# Patient Record
Sex: Male | Born: 1940 | Race: White | Hispanic: No | Marital: Married
Health system: Southern US, Community
[De-identification: ages and names within clinical notes are randomized; demographics above are authoritative.]

---

## 2019-06-16 ENCOUNTER — Other Ambulatory Visit (HOSPITAL_COMMUNITY): Payer: Medicare Other

## 2019-06-16 ENCOUNTER — Inpatient Hospital Stay
Admission: RE | Admit: 2019-06-16 | Discharge: 2019-07-25 | Disposition: A | Discharge status: Skilled Nursing Facility | Payer: Medicare Other | Source: Ambulatory Visit | Attending: Internal Medicine | Admitting: Internal Medicine

## 2019-06-16 DIAGNOSIS — R34 Anuria and oliguria: Secondary | ICD-10-CM

## 2019-06-16 DIAGNOSIS — T17908A Unspecified foreign body in respiratory tract, part unspecified causing other injury, initial encounter: Secondary | ICD-10-CM

## 2019-06-16 DIAGNOSIS — K5900 Constipation, unspecified: Secondary | ICD-10-CM

## 2019-06-17 LAB — COMPREHENSIVE METABOLIC PANEL
ALT: 12 U/L (ref 0–44)
AST: 17 U/L (ref 15–41)
Albumin: 2.4 g/dL — ABNORMAL LOW (ref 3.5–5.0)
Alkaline Phosphatase: 56 U/L (ref 38–126)
Anion gap: 11 (ref 5–15)
BUN: 9 mg/dL (ref 8–23)
CO2: 26 mmol/L (ref 22–32)
Calcium: 8.7 mg/dL — ABNORMAL LOW (ref 8.9–10.3)
Chloride: 103 mmol/L (ref 98–111)
Creatinine, Ser: 0.66 mg/dL (ref 0.61–1.24)
GFR calc Af Amer: >60 mL/min (ref >60)
GFR calc non Af Amer: >60 mL/min (ref >60)
Glucose, Bld: 106 mg/dL — ABNORMAL HIGH (ref 70–99)
Potassium: 3.7 mmol/L (ref 3.5–5.1)
Sodium: 140 mmol/L (ref 135–145)
Total Bilirubin: 0.8 mg/dL (ref 0.3–1.2)
Total Protein: 5.3 g/dL — ABNORMAL LOW (ref 6.5–8.1)

## 2019-06-17 LAB — PROTIME-INR
INR: 1.4 — ABNORMAL HIGH (ref 0.8–1.2)
Prothrombin Time: 16.5 seconds — ABNORMAL HIGH (ref 11.4–15.2)

## 2019-06-17 LAB — CBC WITH DIFFERENTIAL/PLATELET
Abs Immature Granulocytes: 0.07 10*3/uL (ref 0.00–0.07)
Basophils Absolute: 0 10*3/uL (ref 0.0–0.1)
Basophils Relative: 0 %
Eosinophils Absolute: 0 10*3/uL (ref 0.0–0.5)
Eosinophils Relative: 0 %
HCT: 42.3 % (ref 39.0–52.0)
Hemoglobin: 13.9 g/dL (ref 13.0–17.0)
Immature Granulocytes: 1 %
Lymphocytes Relative: 18 %
Lymphs Abs: 1.7 10*3/uL (ref 0.7–4.0)
MCH: 29.9 pg (ref 26.0–34.0)
MCHC: 32.9 g/dL (ref 30.0–36.0)
MCV: 91 fL (ref 80.0–100.0)
Monocytes Absolute: 1 10*3/uL (ref 0.1–1.0)
Monocytes Relative: 10 %
Neutro Abs: 6.4 10*3/uL (ref 1.7–7.7)
Neutrophils Relative %: 71 %
Platelets: 102 10*3/uL — ABNORMAL LOW (ref 150–400)
RBC: 4.65 MIL/uL (ref 4.22–5.81)
RDW: 15.9 % — ABNORMAL HIGH (ref 11.5–15.5)
WBC: 9.1 10*3/uL (ref 4.0–10.5)
nRBC: 0 % (ref 0.0–0.2)

## 2019-06-17 LAB — URINALYSIS, ROUTINE W REFLEX MICROSCOPIC
Bilirubin Urine: NEGATIVE
Glucose, UA: NEGATIVE mg/dL
Ketones, ur: NEGATIVE mg/dL
Nitrite: NEGATIVE
Protein, ur: 100 mg/dL — AB
RBC / HPF: >50 RBC/hpf — ABNORMAL HIGH (ref 0–5)
Specific Gravity, Urine: 1.019 (ref 1.005–1.030)
WBC, UA: >50 WBC/hpf — ABNORMAL HIGH (ref 0–5)
pH: 5 (ref 5.0–8.0)

## 2019-06-17 LAB — HEMOGLOBIN A1C
Hgb A1c MFr Bld: 5.6 % (ref 4.8–5.6)
Mean Plasma Glucose: 114.02 mg/dL

## 2019-06-17 LAB — MAGNESIUM: Magnesium: 1.5 mg/dL — ABNORMAL LOW (ref 1.7–2.4)

## 2019-06-17 LAB — PHOSPHORUS: Phosphorus: 3.5 mg/dL (ref 2.5–4.6)

## 2019-06-17 LAB — TSH: TSH: 1.32 u[IU]/mL (ref 0.350–4.500)

## 2019-06-17 MED ORDER — GENERIC EXTERNAL MEDICATION
Status: DC
Start: 2019-06-16 — End: 2019-06-17

## 2019-06-17 MED ORDER — AMLODIPINE BESYLATE 5 MG PO TABS
5.00 | ORAL_TABLET | ORAL | Status: DC
Start: 2019-06-17 — End: 2019-06-17

## 2019-06-17 MED ORDER — OLANZAPINE 2.5 MG PO TABS
2.50 | ORAL_TABLET | ORAL | Status: DC
Start: 2019-06-16 — End: 2019-06-17

## 2019-06-17 MED ORDER — CLONIDINE HCL 0.1 MG PO TABS
0.10 | ORAL_TABLET | ORAL | Status: DC
Start: ? — End: 2019-06-17

## 2019-06-17 MED ORDER — TIMOLOL MALEATE 0.5 % OP SOLN
1.00 | OPHTHALMIC | Status: DC
Start: 2019-06-17 — End: 2019-06-17

## 2019-06-17 MED ORDER — MELATONIN 3 MG PO TABS
3.00 | ORAL_TABLET | ORAL | Status: DC
Start: 2019-06-16 — End: 2019-06-17

## 2019-06-17 MED ORDER — ENOXAPARIN SODIUM 40 MG/0.4ML ~~LOC~~ SOLN
40.00 | SUBCUTANEOUS | Status: DC
Start: 2019-06-16 — End: 2019-06-17

## 2019-06-17 MED ORDER — LINEZOLID 600 MG PO TABS
600.00 | ORAL_TABLET | ORAL | Status: DC
Start: 2019-06-16 — End: 2019-06-17

## 2019-06-17 MED ORDER — LISINOPRIL 20 MG PO TABS
20.00 | ORAL_TABLET | ORAL | Status: DC
Start: 2019-06-17 — End: 2019-06-17

## 2019-06-17 MED ORDER — SODIUM CHLORIDE 0.9 % IV SOLN
500.00 | INTRAVENOUS | Status: DC
Start: ? — End: 2019-06-17

## 2019-06-17 MED ORDER — DIPHENHYDRAMINE HCL 50 MG/ML IJ SOLN
50.00 | INTRAMUSCULAR | Status: DC
Start: ? — End: 2019-06-17

## 2019-06-17 MED ORDER — LEVOFLOXACIN 750 MG PO TABS
750.00 | ORAL_TABLET | ORAL | Status: DC
Start: 2019-06-17 — End: 2019-06-17

## 2019-06-17 MED ORDER — OLANZAPINE 2.5 MG PO TABS
2.50 | ORAL_TABLET | ORAL | Status: DC
Start: ? — End: 2019-06-17

## 2019-06-17 MED ORDER — OLANZAPINE 10 MG IM SOLR
2.50 | INTRAMUSCULAR | Status: DC
Start: ? — End: 2019-06-17

## 2019-06-17 MED ORDER — VITAMIN D3 25 MCG (1000 UNIT) PO TABS
1000.00 | ORAL_TABLET | ORAL | Status: DC
Start: 2019-06-17 — End: 2019-06-17

## 2019-06-17 MED ORDER — ONDANSETRON 4 MG PO TBDP
4.00 | ORAL_TABLET | ORAL | Status: DC
Start: ? — End: 2019-06-17

## 2019-06-17 MED ORDER — RUBBER BATH MAT MISC
Status: DC
Start: 2019-06-16 — End: 2019-06-17

## 2019-06-17 MED ORDER — METRONIDAZOLE 250 MG PO TABS
500.00 | ORAL_TABLET | ORAL | Status: DC
Start: 2019-06-16 — End: 2019-06-17

## 2019-06-17 NOTE — Consult Note (Signed)
Infectious Disease Consultation   Gregory Mccall.  QQI:[id number]  DOB: Mar 01, 1941  DOA: 06/16/2019  Requesting physician: Dr.Hijazi  Reason for consultation: Antibiotic recommendations   History of Present Illness: Patient unable to provide history and information secondary to his dementia.  Therefore obtained from the medical records. Gregory Mccall. is an 78 y.o. male with history of hypertension, prostate cancer, advanced dementia who was admitted initially at behavioral health unit for management of his behavioral disorders due to his dementia.  Patient apparently remained increasingly drowsy and was found to have a fever.  He had decubitus wound on his gluteal area that was progressively worsening.  He was transferred to the medical floor.  CT of the abdomen and pelvis showed soft tissue gas in the right perirectal fat, posterior and left of the coccyx involving the right gluteal musculature worrisome for necrotizing fasciitis.  He was also found to have leukocytosis.  Patient was found to be hypotensive, and septic shock. He was initially intubated and was in the ICU care.  He was taken to the OR urgently on 05/28/2019 for debridement of his complex wound/ulcer.  Blood cultures on 05/27/2019 did not show any growth.  He had fevers.  Repeat blood cultures on 05/28/2019 showed 1 set with 2 strains of staph epidermidis both MSSE while the other side showed MSSE and MRSE.  Urine cultures showed 100,000 CFU per mL Pantoea agglomerans, Enterococcus faecalis.  Patient underwent debridement again on 05/30/2019.  Specimen from the gluteal abscess showed E. coli, Clostridium sporogenes, Enterococcus species,Bacteriodes fragilis, Pseudomonas aeruginosa.  He was initially on pressors but weaned off pressors.  He has received treatment with multiple antibiotics including vancomycin, cefepime, clindamycin, Zosyn, ciprofloxacin.  He also had lactic acidosis.  He has ostomy, Foley catheter.   Once he stabilized [facility] was [facility]. Patient at this time knows he is in the hospital but he is confused about the year.  Review of Systems:  Patient history of dementia, confused.  Unable to obtain review of systems at this time from the patient.   Past Medical History: Dyslipidemia, hypertension, prostate cancer, dementia, colon polyps, obesity, obstructive sleep apnea, GERD, history of Bell's palsy, polycythemia vera  Past Surgical History: Ankle fracture surgery, laminectomy in 2011, prostatectomy in 2004.  Recent surgery for necrotizing fasciitis.  Allergies: No known drug allergies  Social History: He is a former smoker, no history of alcohol or recreational drug abuse  Family History: History of hypertension in father and sister, cancer in maternal aunt   Physical Exam: Temperature 97.7, pulse 93, respiratory 23, blood pressure 136/83, oxygen saturation 95% on oxygen nasal cannula.  Constitutional: Ill-appearing male, awake, not in any acute distress at this time Eyes: PERLA, EOMI, irises appear normal, anicteric sclera,  ENMT: external ears and nose appear normal, hard of hearing            Neck: neck appears normal, no masses, normal ROM  CVS: S1-S2 clear, no murmur Respiratory: Occasional rhonchi, decreased breath sounds lower lobes, no wheezing Abdomen: Surgical incision mid abdomen, ostomy, positive bowel sounds Musculoskeletal: Mild lower extremity edema Neuro: Patient has dementia, oriented x2, has generalized weakness, no focal deficits Psych: Has dementia Skin: Sacrococcygeal stage IV pressure ulcer status post recent debridement for necrotizing fasciitis  Data reviewed:  I have personally reviewed following labs and imaging studies Labs:  CBC: Recent Labs  Lab 06/17/19 0456  WBC 9.1  NEUTROABS 6.4  HGB 13.9  HCT 42.3  MCV 91.0  PLT 102*    Basic Metabolic Panel: Recent Labs  Lab 06/17/19 0456  NA 140  K 3.7  CL 103  CO2 26   GLUCOSE 106*  BUN 9  CREATININE 0.66  CALCIUM 8.7*  MG 1.5*  PHOS 3.5   GFR CrCl cannot be calculated (Unknown ideal weight.). Liver Function Tests: Recent Labs  Lab 06/17/19 0456  AST 17  ALT 12  ALKPHOS 56  BILITOT 0.8  PROT 5.3*  ALBUMIN 2.4*   No results for input(s): LIPASE, AMYLASE in the last 168 hours. No results for input(s): AMMONIA in the last 168 hours. Coagulation profile Recent Labs  Lab 06/17/19 0456  INR 1.4*    Cardiac Enzymes: No results for input(s): CKTOTAL, CKMB, CKMBINDEX, TROPONINI in the last 168 hours. BNP: Invalid input(s): POCBNP CBG: No results for input(s): GLUCAP in the last 168 hours. D-Dimer No results for input(s): DDIMER in the last 72 hours. Hgb A1c Recent Labs    06/17/19 0456  HGBA1C 5.6   Lipid Profile No results for input(s): CHOL, HDL, LDLCALC, TRIG, CHOLHDL, LDLDIRECT in the last 72 hours. Thyroid function studies Recent Labs    06/17/19 0456  TSH 1.320   Anemia work up No results for input(s): VITAMINB12, FOLATE, FERRITIN, TIBC, IRON, RETICCTPCT in the last 72 hours. Urinalysis    Component Value Date/Time   COLORURINE AMBER (A) 06/17/2019 1524   APPEARANCEUR CLOUDY (A) 06/17/2019 1524   LABSPEC 1.019 06/17/2019 1524   PHURINE 5.0 06/17/2019 1524   GLUCOSEU NEGATIVE 06/17/2019 1524   HGBUR LARGE (A) 06/17/2019 [place] 06/17/2019 [place] 06/17/2019 1524   PROTEINUR 100 (A) 06/17/2019 1524   NITRITE NEGATIVE 06/17/2019 1524   LEUKOCYTESUR MODERATE (A) 06/17/2019 1524     Microbiology No results found for this or any previous visit (from the past 240 hour(s)).     Inpatient Medications:   Scheduled Meds: Please see MAR   Radiological Exams on Admission: Dg Chest Port 1 View  Result Date: 06/16/2019 CLINICAL DATA:  Aspiration. EXAM: PORTABLE CHEST 1 VIEW COMPARISON:  None. FINDINGS: Poor inspiration. Normal sized heart. Clear lungs. Thoracic spine  degenerative changes. IMPRESSION: No acute abnormality. Electronically Signed   By: Claudie Revering M.D.   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  He is status post debridement x2.  Cultures as mentioned above in the HPI.  While at the outside facility he received treatment with IV vancomycin, Zosyn.  He also received treatment with cefepime, ciprofloxacin.  Currently shock is resolved.  However, he is at very high risk for recurrent sepsis.  If he starts having any worsening fevers or leukocytosis would recommend to send for pan cultures.  Necrotizing fasciitis of the gluteal area: Status post debridement x2.  Continue local wound care.  Specimen from the gluteal abscess showed E. coli, Clostridium sporogenes, Enterococcus species,Bacteriodes fragilis, Pseudomonas aeruginosa.  Currently on treatment with Levaquin, Flagyl, linezolid.  Recommend to treat for duration of 2 weeks.  Please monitor CBC and platelet counts especially with the linezolid.  If he starts having any worsening fevers or worsening leukocytosis then recommend to send for pan cultures.  Stage IV sacrococcygeal pressure ulcer: With superimposed necrotizing fasciitis.  He is  status post debridement as mentioned above.  Continue local wound care.  He is at high risk for wound worsening.  Again, as mentioned above if he starts having any worsening fevers would recommend to send for pan cultures.  Advanced dementia: Patient due to his dementia he will has likely dysphagia with aspiration as well.  He is high risk for worsening respiratory failure secondary to aspiration pneumonia.  Currently on oxygen by nasal cannula.  Continue to monitor.  Obesity/obstructive sleep  apnea: Continue supportive management per primary team.  Moderate protein calorie malnutrition: Management per the primary team.  Due to his complex medical problems he is very high risk for worsening anticoagulation.  Thank you for this consultation.  The above document was dictated using a voice recognition device.  Vonzella NippleAnupama Ishmael Berkovich M.D. 06/17/2019, 5:08 PM

## 2019-06-18 LAB — BASIC METABOLIC PANEL
Anion gap: 9 (ref 5–15)
BUN: 14 mg/dL (ref 8–23)
CO2: 27 mmol/L (ref 22–32)
Calcium: 8.4 mg/dL — ABNORMAL LOW (ref 8.9–10.3)
Chloride: 102 mmol/L (ref 98–111)
Creatinine, Ser: 0.63 mg/dL (ref 0.61–1.24)
GFR calc Af Amer: >60 mL/min (ref >60)
GFR calc non Af Amer: >60 mL/min (ref >60)
Glucose, Bld: 108 mg/dL — ABNORMAL HIGH (ref 70–99)
Potassium: 3.5 mmol/L (ref 3.5–5.1)
Sodium: 138 mmol/L (ref 135–145)

## 2019-06-18 LAB — CBC
HCT: 39.2 % (ref 39.0–52.0)
Hemoglobin: 13.1 g/dL (ref 13.0–17.0)
MCH: 30.3 pg (ref 26.0–34.0)
MCHC: 33.4 g/dL (ref 30.0–36.0)
MCV: 90.5 fL (ref 80.0–100.0)
Platelets: 78 10*3/uL — ABNORMAL LOW (ref 150–400)
RBC: 4.33 MIL/uL (ref 4.22–5.81)
RDW: 15.9 % — ABNORMAL HIGH (ref 11.5–15.5)
WBC: 8.4 10*3/uL (ref 4.0–10.5)
nRBC: 0 % (ref 0.0–0.2)

## 2019-06-18 LAB — URINE CULTURE: Culture: >=100000 — AB

## 2019-06-18 LAB — MAGNESIUM: Magnesium: 1.5 mg/dL — ABNORMAL LOW (ref 1.7–2.4)

## 2019-06-18 LAB — PHOSPHORUS: Phosphorus: 3.3 mg/dL (ref 2.5–4.6)

## 2019-06-19 LAB — MAGNESIUM: Magnesium: 1.6 mg/dL — ABNORMAL LOW (ref 1.7–2.4)

## 2019-06-19 LAB — BASIC METABOLIC PANEL
Anion gap: 9 (ref 5–15)
BUN: 13 mg/dL (ref 8–23)
CO2: 27 mmol/L (ref 22–32)
Calcium: 8.3 mg/dL — ABNORMAL LOW (ref 8.9–10.3)
Chloride: 102 mmol/L (ref 98–111)
Creatinine, Ser: 0.54 mg/dL — ABNORMAL LOW (ref 0.61–1.24)
GFR calc Af Amer: >60 mL/min (ref >60)
GFR calc non Af Amer: >60 mL/min (ref >60)
Glucose, Bld: 107 mg/dL — ABNORMAL HIGH (ref 70–99)
Potassium: 3.4 mmol/L — ABNORMAL LOW (ref 3.5–5.1)
Sodium: 138 mmol/L (ref 135–145)

## 2019-06-20 LAB — CBC
HCT: 43.4 % (ref 39.0–52.0)
Hemoglobin: 14.3 g/dL (ref 13.0–17.0)
MCH: 30.1 pg (ref 26.0–34.0)
MCHC: 32.9 g/dL (ref 30.0–36.0)
MCV: 91.4 fL (ref 80.0–100.0)
Platelets: 68 10*3/uL — ABNORMAL LOW (ref 150–400)
RBC: 4.75 MIL/uL (ref 4.22–5.81)
RDW: 16.5 % — ABNORMAL HIGH (ref 11.5–15.5)
WBC: 7.3 10*3/uL (ref 4.0–10.5)
nRBC: 0 % (ref 0.0–0.2)

## 2019-06-20 LAB — BASIC METABOLIC PANEL
Anion gap: 9 (ref 5–15)
BUN: 11 mg/dL (ref 8–23)
CO2: 28 mmol/L (ref 22–32)
Calcium: 8.5 mg/dL — ABNORMAL LOW (ref 8.9–10.3)
Chloride: 101 mmol/L (ref 98–111)
Creatinine, Ser: 0.45 mg/dL — ABNORMAL LOW (ref 0.61–1.24)
GFR calc Af Amer: >60 mL/min (ref >60)
GFR calc non Af Amer: >60 mL/min (ref >60)
Glucose, Bld: 92 mg/dL (ref 70–99)
Potassium: 4.1 mmol/L (ref 3.5–5.1)
Sodium: 138 mmol/L (ref 135–145)

## 2019-06-20 LAB — MAGNESIUM: Magnesium: 1.8 mg/dL (ref 1.7–2.4)

## 2019-06-20 LAB — PHOSPHORUS: Phosphorus: 2.8 mg/dL (ref 2.5–4.6)

## 2019-06-21 ENCOUNTER — Other Ambulatory Visit (HOSPITAL_COMMUNITY): Payer: Medicare Other

## 2019-06-21 LAB — BASIC METABOLIC PANEL
Anion gap: 11 (ref 5–15)
BUN: 11 mg/dL (ref 8–23)
CO2: 25 mmol/L (ref 22–32)
Calcium: 8.7 mg/dL — ABNORMAL LOW (ref 8.9–10.3)
Chloride: 103 mmol/L (ref 98–111)
Creatinine, Ser: 0.59 mg/dL — ABNORMAL LOW (ref 0.61–1.24)
GFR calc Af Amer: >60 mL/min (ref >60)
GFR calc non Af Amer: >60 mL/min (ref >60)
Glucose, Bld: 114 mg/dL — ABNORMAL HIGH (ref 70–99)
Potassium: 3.9 mmol/L (ref 3.5–5.1)
Sodium: 139 mmol/L (ref 135–145)

## 2019-06-21 LAB — CBC
HCT: 45.8 % (ref 39.0–52.0)
Hemoglobin: 14.9 g/dL (ref 13.0–17.0)
MCH: 29.6 pg (ref 26.0–34.0)
MCHC: 32.5 g/dL (ref 30.0–36.0)
MCV: 90.9 fL (ref 80.0–100.0)
Platelets: 67 10*3/uL — ABNORMAL LOW (ref 150–400)
RBC: 5.04 MIL/uL (ref 4.22–5.81)
RDW: 17.1 % — ABNORMAL HIGH (ref 11.5–15.5)
WBC: 7.2 10*3/uL (ref 4.0–10.5)
nRBC: 0 % (ref 0.0–0.2)

## 2019-06-21 LAB — MAGNESIUM: Magnesium: 1.6 mg/dL — ABNORMAL LOW (ref 1.7–2.4)

## 2019-06-22 LAB — BASIC METABOLIC PANEL
Anion gap: 8 (ref 5–15)
BUN: 13 mg/dL (ref 8–23)
CO2: 27 mmol/L (ref 22–32)
Calcium: 8.4 mg/dL — ABNORMAL LOW (ref 8.9–10.3)
Chloride: 105 mmol/L (ref 98–111)
Creatinine, Ser: 0.5 mg/dL — ABNORMAL LOW (ref 0.61–1.24)
GFR calc Af Amer: >60 mL/min (ref >60)
GFR calc non Af Amer: >60 mL/min (ref >60)
Glucose, Bld: 119 mg/dL — ABNORMAL HIGH (ref 70–99)
Potassium: 4 mmol/L (ref 3.5–5.1)
Sodium: 140 mmol/L (ref 135–145)

## 2019-06-22 LAB — HEPARIN INDUCED PLATELET AB (HIT ANTIBODY): Heparin Induced Plt Ab: 0.098 OD (ref 0.000–0.400)

## 2019-06-22 LAB — MAGNESIUM: Magnesium: 1.8 mg/dL (ref 1.7–2.4)

## 2019-06-26 LAB — BASIC METABOLIC PANEL
Anion gap: 10 (ref 5–15)
BUN: 17 mg/dL (ref 8–23)
CO2: 29 mmol/L (ref 22–32)
Calcium: 9 mg/dL (ref 8.9–10.3)
Chloride: 102 mmol/L (ref 98–111)
Creatinine, Ser: 0.55 mg/dL — ABNORMAL LOW (ref 0.61–1.24)
GFR calc Af Amer: >60 mL/min (ref >60)
GFR calc non Af Amer: >60 mL/min (ref >60)
Glucose, Bld: 103 mg/dL — ABNORMAL HIGH (ref 70–99)
Potassium: 3.8 mmol/L (ref 3.5–5.1)
Sodium: 141 mmol/L (ref 135–145)

## 2019-06-26 LAB — CBC
HCT: 42.4 % (ref 39.0–52.0)
Hemoglobin: 13.8 g/dL (ref 13.0–17.0)
MCH: 29.7 pg (ref 26.0–34.0)
MCHC: 32.5 g/dL (ref 30.0–36.0)
MCV: 91.4 fL (ref 80.0–100.0)
Platelets: 143 10*3/uL — ABNORMAL LOW (ref 150–400)
RBC: 4.64 MIL/uL (ref 4.22–5.81)
RDW: 18 % — ABNORMAL HIGH (ref 11.5–15.5)
WBC: 9.5 10*3/uL (ref 4.0–10.5)
nRBC: 0 % (ref 0.0–0.2)

## 2019-06-26 LAB — MAGNESIUM: Magnesium: 1.8 mg/dL (ref 1.7–2.4)

## 2019-06-28 LAB — BASIC METABOLIC PANEL
Anion gap: 8 (ref 5–15)
BUN: 17 mg/dL (ref 8–23)
CO2: 29 mmol/L (ref 22–32)
Calcium: 9.1 mg/dL (ref 8.9–10.3)
Chloride: 103 mmol/L (ref 98–111)
Creatinine, Ser: 0.63 mg/dL (ref 0.61–1.24)
GFR calc Af Amer: >60 mL/min (ref >60)
GFR calc non Af Amer: >60 mL/min (ref >60)
Glucose, Bld: 95 mg/dL (ref 70–99)
Potassium: 3.6 mmol/L (ref 3.5–5.1)
Sodium: 140 mmol/L (ref 135–145)

## 2019-06-28 LAB — CBC
HCT: 45.8 % (ref 39.0–52.0)
Hemoglobin: 15.1 g/dL (ref 13.0–17.0)
MCH: 29.9 pg (ref 26.0–34.0)
MCHC: 33 g/dL (ref 30.0–36.0)
MCV: 90.7 fL (ref 80.0–100.0)
Platelets: 169 10*3/uL (ref 150–400)
RBC: 5.05 MIL/uL (ref 4.22–5.81)
RDW: 17.9 % — ABNORMAL HIGH (ref 11.5–15.5)
WBC: 9.8 10*3/uL (ref 4.0–10.5)
nRBC: 0 % (ref 0.0–0.2)

## 2019-06-28 LAB — MAGNESIUM: Magnesium: 1.9 mg/dL (ref 1.7–2.4)

## 2019-06-28 LAB — PHOSPHORUS: Phosphorus: 4 mg/dL (ref 2.5–4.6)

## 2019-06-30 LAB — CBC
HCT: 42.9 % (ref 39.0–52.0)
Hemoglobin: 14.1 g/dL (ref 13.0–17.0)
MCH: 29.9 pg (ref 26.0–34.0)
MCHC: 32.9 g/dL (ref 30.0–36.0)
MCV: 90.9 fL (ref 80.0–100.0)
Platelets: 175 10*3/uL (ref 150–400)
RBC: 4.72 MIL/uL (ref 4.22–5.81)
RDW: 17.2 % — ABNORMAL HIGH (ref 11.5–15.5)
WBC: 17.1 10*3/uL — ABNORMAL HIGH (ref 4.0–10.5)
nRBC: 0 % (ref 0.0–0.2)

## 2019-06-30 LAB — RENAL FUNCTION PANEL
Albumin: 2.4 g/dL — ABNORMAL LOW (ref 3.5–5.0)
Anion gap: 10 (ref 5–15)
BUN: 24 mg/dL — ABNORMAL HIGH (ref 8–23)
CO2: 27 mmol/L (ref 22–32)
Calcium: 8.9 mg/dL (ref 8.9–10.3)
Chloride: 101 mmol/L (ref 98–111)
Creatinine, Ser: 0.84 mg/dL (ref 0.61–1.24)
GFR calc Af Amer: >60 mL/min (ref >60)
GFR calc non Af Amer: >60 mL/min (ref >60)
Glucose, Bld: 103 mg/dL — ABNORMAL HIGH (ref 70–99)
Phosphorus: 2.7 mg/dL (ref 2.5–4.6)
Potassium: 3.9 mmol/L (ref 3.5–5.1)
Sodium: 138 mmol/L (ref 135–145)

## 2019-06-30 LAB — MAGNESIUM: Magnesium: 1.6 mg/dL — ABNORMAL LOW (ref 1.7–2.4)

## 2019-07-01 LAB — BASIC METABOLIC PANEL
Anion gap: 11 (ref 5–15)
BUN: 34 mg/dL — ABNORMAL HIGH (ref 8–23)
CO2: 27 mmol/L (ref 22–32)
Calcium: 8.7 mg/dL — ABNORMAL LOW (ref 8.9–10.3)
Chloride: 100 mmol/L (ref 98–111)
Creatinine, Ser: 0.71 mg/dL (ref 0.61–1.24)
GFR calc Af Amer: >60 mL/min (ref >60)
GFR calc non Af Amer: >60 mL/min (ref >60)
Glucose, Bld: 108 mg/dL — ABNORMAL HIGH (ref 70–99)
Potassium: 4.2 mmol/L (ref 3.5–5.1)
Sodium: 138 mmol/L (ref 135–145)

## 2019-07-01 LAB — CBC
HCT: 41.5 % (ref 39.0–52.0)
Hemoglobin: 13.7 g/dL (ref 13.0–17.0)
MCH: 29.9 pg (ref 26.0–34.0)
MCHC: 33 g/dL (ref 30.0–36.0)
MCV: 90.6 fL (ref 80.0–100.0)
Platelets: 150 10*3/uL (ref 150–400)
RBC: 4.58 MIL/uL (ref 4.22–5.81)
RDW: 17.7 % — ABNORMAL HIGH (ref 11.5–15.5)
WBC: 14.1 10*3/uL — ABNORMAL HIGH (ref 4.0–10.5)
nRBC: 0 % (ref 0.0–0.2)

## 2019-07-01 LAB — MAGNESIUM: Magnesium: 2.1 mg/dL (ref 1.7–2.4)

## 2019-07-03 LAB — BASIC METABOLIC PANEL
Anion gap: 10 (ref 5–15)
BUN: 20 mg/dL (ref 8–23)
CO2: 27 mmol/L (ref 22–32)
Calcium: 8.9 mg/dL (ref 8.9–10.3)
Chloride: 101 mmol/L (ref 98–111)
Creatinine, Ser: 0.59 mg/dL — ABNORMAL LOW (ref 0.61–1.24)
GFR calc Af Amer: >60 mL/min (ref >60)
GFR calc non Af Amer: >60 mL/min (ref >60)
Glucose, Bld: 93 mg/dL (ref 70–99)
Potassium: 4.6 mmol/L (ref 3.5–5.1)
Sodium: 138 mmol/L (ref 135–145)

## 2019-07-03 LAB — CBC
HCT: 44.3 % (ref 39.0–52.0)
Hemoglobin: 14.5 g/dL (ref 13.0–17.0)
MCH: 30.1 pg (ref 26.0–34.0)
MCHC: 32.7 g/dL (ref 30.0–36.0)
MCV: 91.9 fL (ref 80.0–100.0)
Platelets: 154 10*3/uL (ref 150–400)
RBC: 4.82 MIL/uL (ref 4.22–5.81)
RDW: 17.2 % — ABNORMAL HIGH (ref 11.5–15.5)
WBC: 10.5 10*3/uL (ref 4.0–10.5)
nRBC: 0 % (ref 0.0–0.2)

## 2019-07-03 LAB — MAGNESIUM: Magnesium: 1.7 mg/dL (ref 1.7–2.4)

## 2019-07-03 LAB — PHOSPHORUS: Phosphorus: 3.9 mg/dL (ref 2.5–4.6)

## 2019-07-04 LAB — MAGNESIUM
Magnesium: 1.7 mg/dL (ref 1.7–2.4)
Magnesium: 1.8 mg/dL (ref 1.7–2.4)

## 2019-07-05 LAB — MAGNESIUM: Magnesium: 1.8 mg/dL (ref 1.7–2.4)

## 2019-07-08 LAB — BASIC METABOLIC PANEL
Anion gap: 11 (ref 5–15)
BUN: 38 mg/dL — ABNORMAL HIGH (ref 8–23)
CO2: 26 mmol/L (ref 22–32)
Calcium: 9.3 mg/dL (ref 8.9–10.3)
Chloride: 96 mmol/L — ABNORMAL LOW (ref 98–111)
Creatinine, Ser: 0.8 mg/dL (ref 0.61–1.24)
GFR calc Af Amer: >60 mL/min (ref >60)
GFR calc non Af Amer: >60 mL/min (ref >60)
Glucose, Bld: 141 mg/dL — ABNORMAL HIGH (ref 70–99)
Potassium: 4.4 mmol/L (ref 3.5–5.1)
Sodium: 133 mmol/L — ABNORMAL LOW (ref 135–145)

## 2019-07-08 LAB — CBC
HCT: 43 % (ref 39.0–52.0)
Hemoglobin: 13.9 g/dL (ref 13.0–17.0)
MCH: 30.2 pg (ref 26.0–34.0)
MCHC: 32.3 g/dL (ref 30.0–36.0)
MCV: 93.3 fL (ref 80.0–100.0)
Platelets: 208 10*3/uL (ref 150–400)
RBC: 4.61 MIL/uL (ref 4.22–5.81)
RDW: 18.1 % — ABNORMAL HIGH (ref 11.5–15.5)
WBC: 16.4 10*3/uL — ABNORMAL HIGH (ref 4.0–10.5)
nRBC: 0 % (ref 0.0–0.2)

## 2019-07-08 LAB — MAGNESIUM: Magnesium: 1.8 mg/dL (ref 1.7–2.4)

## 2019-07-09 ENCOUNTER — Encounter (HOSPITAL_BASED_OUTPATIENT_CLINIC_OR_DEPARTMENT_OTHER): Payer: Medicare Other

## 2019-07-09 DIAGNOSIS — M7989 Other specified soft tissue disorders: Secondary | ICD-10-CM

## 2019-07-09 LAB — URINALYSIS, ROUTINE W REFLEX MICROSCOPIC
Bilirubin Urine: NEGATIVE
Glucose, UA: NEGATIVE mg/dL
Ketones, ur: NEGATIVE mg/dL
Nitrite: NEGATIVE
Protein, ur: 100 mg/dL — AB
RBC / HPF: >50 RBC/hpf — ABNORMAL HIGH (ref 0–5)
Specific Gravity, Urine: 1.025 (ref 1.005–1.030)
WBC, UA: >50 WBC/hpf — ABNORMAL HIGH (ref 0–5)
pH: 5 (ref 5.0–8.0)

## 2019-07-09 LAB — CBC WITH DIFFERENTIAL/PLATELET
Abs Immature Granulocytes: 0.14 10*3/uL — ABNORMAL HIGH (ref 0.00–0.07)
Basophils Absolute: 0 10*3/uL (ref 0.0–0.1)
Basophils Relative: 0 %
Eosinophils Absolute: 0 10*3/uL (ref 0.0–0.5)
Eosinophils Relative: 0 %
HCT: 41.1 % (ref 39.0–52.0)
Hemoglobin: 13.5 g/dL (ref 13.0–17.0)
Immature Granulocytes: 1 %
Lymphocytes Relative: 11 %
Lymphs Abs: 1.6 10*3/uL (ref 0.7–4.0)
MCH: 30.5 pg (ref 26.0–34.0)
MCHC: 32.8 g/dL (ref 30.0–36.0)
MCV: 93 fL (ref 80.0–100.0)
Monocytes Absolute: 1 10*3/uL (ref 0.1–1.0)
Monocytes Relative: 7 %
Neutro Abs: 11.8 10*3/uL — ABNORMAL HIGH (ref 1.7–7.7)
Neutrophils Relative %: 81 %
Platelets: 161 10*3/uL (ref 150–400)
RBC: 4.42 MIL/uL (ref 4.22–5.81)
RDW: 17.4 % — ABNORMAL HIGH (ref 11.5–15.5)
WBC: 14.5 10*3/uL — ABNORMAL HIGH (ref 4.0–10.5)
nRBC: 0 % (ref 0.0–0.2)

## 2019-07-09 LAB — APTT: aPTT: 36 seconds (ref 24–36)

## 2019-07-09 NOTE — Progress Notes (Signed)
Bilateral lower extremity venous doppler performed   07/09/19 Cardell Peach RDCS, RVT

## 2019-07-10 LAB — BASIC METABOLIC PANEL
Anion gap: 10 (ref 5–15)
BUN: 26 mg/dL — ABNORMAL HIGH (ref 8–23)
CO2: 26 mmol/L (ref 22–32)
Calcium: 9.7 mg/dL (ref 8.9–10.3)
Chloride: 99 mmol/L (ref 98–111)
Creatinine, Ser: 0.66 mg/dL (ref 0.61–1.24)
GFR calc Af Amer: >60 mL/min (ref >60)
GFR calc non Af Amer: >60 mL/min (ref >60)
Glucose, Bld: 112 mg/dL — ABNORMAL HIGH (ref 70–99)
Potassium: 5.5 mmol/L — ABNORMAL HIGH (ref 3.5–5.1)
Sodium: 135 mmol/L (ref 135–145)

## 2019-07-10 LAB — CBC
HCT: 41.6 % (ref 39.0–52.0)
Hemoglobin: 14 g/dL (ref 13.0–17.0)
MCH: 31.1 pg (ref 26.0–34.0)
MCHC: 33.7 g/dL (ref 30.0–36.0)
MCV: 92.4 fL (ref 80.0–100.0)
Platelets: 172 10*3/uL (ref 150–400)
RBC: 4.5 MIL/uL (ref 4.22–5.81)
RDW: 17.2 % — ABNORMAL HIGH (ref 11.5–15.5)
WBC: 14.9 10*3/uL — ABNORMAL HIGH (ref 4.0–10.5)
nRBC: 0 % (ref 0.0–0.2)

## 2019-07-10 LAB — APTT: aPTT: 51 seconds — ABNORMAL HIGH (ref 24–36)

## 2019-07-10 LAB — PROTIME-INR
INR: 1.1 (ref 0.8–1.2)
Prothrombin Time: 14.4 seconds (ref 11.4–15.2)

## 2019-07-10 LAB — HEPARIN LEVEL (UNFRACTIONATED)
Heparin Unfractionated: 0.19 IU/mL — ABNORMAL LOW (ref 0.30–0.70)
Heparin Unfractionated: 0.44 IU/mL (ref 0.30–0.70)
Heparin Unfractionated: 0.22 IU/mL — ABNORMAL LOW (ref 0.30–0.70)

## 2019-07-10 LAB — MAGNESIUM: Magnesium: 1.8 mg/dL (ref 1.7–2.4)

## 2019-07-11 LAB — BASIC METABOLIC PANEL
Anion gap: 10 (ref 5–15)
BUN: 16 mg/dL (ref 8–23)
CO2: 27 mmol/L (ref 22–32)
Calcium: 9.3 mg/dL (ref 8.9–10.3)
Chloride: 97 mmol/L — ABNORMAL LOW (ref 98–111)
Creatinine, Ser: 0.68 mg/dL (ref 0.61–1.24)
GFR calc Af Amer: >60 mL/min (ref >60)
GFR calc non Af Amer: >60 mL/min (ref >60)
Glucose, Bld: 111 mg/dL — ABNORMAL HIGH (ref 70–99)
Potassium: 4.7 mmol/L (ref 3.5–5.1)
Sodium: 134 mmol/L — ABNORMAL LOW (ref 135–145)

## 2019-07-11 LAB — CBC
HCT: 41.5 % (ref 39.0–52.0)
Hemoglobin: 14.1 g/dL (ref 13.0–17.0)
MCH: 30.9 pg (ref 26.0–34.0)
MCHC: 34 g/dL (ref 30.0–36.0)
MCV: 91 fL (ref 80.0–100.0)
Platelets: 179 10*3/uL (ref 150–400)
RBC: 4.56 MIL/uL (ref 4.22–5.81)
RDW: 16.8 % — ABNORMAL HIGH (ref 11.5–15.5)
WBC: 14.2 10*3/uL — ABNORMAL HIGH (ref 4.0–10.5)
nRBC: 0 % (ref 0.0–0.2)

## 2019-07-11 LAB — APTT
aPTT: 32 seconds (ref 24–36)
aPTT: 46 seconds — ABNORMAL HIGH (ref 24–36)

## 2019-07-11 LAB — HEPARIN LEVEL (UNFRACTIONATED)
Heparin Unfractionated: <0.1 IU/mL — ABNORMAL LOW (ref 0.30–0.70)
Heparin Unfractionated: <0.1 IU/mL — ABNORMAL LOW (ref 0.30–0.70)

## 2019-07-11 LAB — CBC WITH DIFFERENTIAL/PLATELET
Abs Immature Granulocytes: 0.06 10*3/uL (ref 0.00–0.07)
Basophils Absolute: 0 10*3/uL (ref 0.0–0.1)
Basophils Relative: 0 %
Eosinophils Absolute: 0 10*3/uL (ref 0.0–0.5)
Eosinophils Relative: 0 %
HCT: 42.7 % (ref 39.0–52.0)
Hemoglobin: 14.2 g/dL (ref 13.0–17.0)
Immature Granulocytes: 1 %
Lymphocytes Relative: 20 %
Lymphs Abs: 2.4 10*3/uL (ref 0.7–4.0)
MCH: 30.9 pg (ref 26.0–34.0)
MCHC: 33.3 g/dL (ref 30.0–36.0)
MCV: 92.8 fL (ref 80.0–100.0)
Monocytes Absolute: 1.2 10*3/uL — ABNORMAL HIGH (ref 0.1–1.0)
Monocytes Relative: 10 %
Neutro Abs: 8.2 10*3/uL — ABNORMAL HIGH (ref 1.7–7.7)
Neutrophils Relative %: 69 %
Platelets: 181 10*3/uL (ref 150–400)
RBC: 4.6 MIL/uL (ref 4.22–5.81)
RDW: 17.2 % — ABNORMAL HIGH (ref 11.5–15.5)
WBC: 11.8 10*3/uL — ABNORMAL HIGH (ref 4.0–10.5)
nRBC: 0 % (ref 0.0–0.2)

## 2019-07-11 LAB — URINE CULTURE: Culture: >=100000 — AB

## 2019-07-12 LAB — BASIC METABOLIC PANEL
Anion gap: 8 (ref 5–15)
BUN: 14 mg/dL (ref 8–23)
CO2: 28 mmol/L (ref 22–32)
Calcium: 8.6 mg/dL — ABNORMAL LOW (ref 8.9–10.3)
Chloride: 98 mmol/L (ref 98–111)
Creatinine, Ser: 0.58 mg/dL — ABNORMAL LOW (ref 0.61–1.24)
GFR calc Af Amer: >60 mL/min (ref >60)
GFR calc non Af Amer: >60 mL/min (ref >60)
Glucose, Bld: 123 mg/dL — ABNORMAL HIGH (ref 70–99)
Potassium: 4 mmol/L (ref 3.5–5.1)
Sodium: 134 mmol/L — ABNORMAL LOW (ref 135–145)

## 2019-07-12 LAB — CBC
HCT: 35.4 % — ABNORMAL LOW (ref 39.0–52.0)
Hemoglobin: 12.1 g/dL — ABNORMAL LOW (ref 13.0–17.0)
MCH: 31.3 pg (ref 26.0–34.0)
MCHC: 34.2 g/dL (ref 30.0–36.0)
MCV: 91.7 fL (ref 80.0–100.0)
Platelets: 151 10*3/uL (ref 150–400)
RBC: 3.86 MIL/uL — ABNORMAL LOW (ref 4.22–5.81)
RDW: 17.3 % — ABNORMAL HIGH (ref 11.5–15.5)
WBC: 7.3 10*3/uL (ref 4.0–10.5)
nRBC: 0 % (ref 0.0–0.2)

## 2019-07-12 LAB — HEPARIN LEVEL (UNFRACTIONATED)
Heparin Unfractionated: 0.42 IU/mL (ref 0.30–0.70)
Heparin Unfractionated: 0.17 IU/mL — ABNORMAL LOW (ref 0.30–0.70)
Heparin Unfractionated: <0.1 IU/mL — ABNORMAL LOW (ref 0.30–0.70)

## 2019-07-12 LAB — MAGNESIUM
Magnesium: 1.5 mg/dL — ABNORMAL LOW (ref 1.7–2.4)
Magnesium: 1.4 mg/dL — ABNORMAL LOW (ref 1.7–2.4)

## 2019-07-12 LAB — PHOSPHORUS: Phosphorus: 4 mg/dL (ref 2.5–4.6)

## 2019-07-13 LAB — CBC
HCT: 37.6 % — ABNORMAL LOW (ref 39.0–52.0)
Hemoglobin: 12.7 g/dL — ABNORMAL LOW (ref 13.0–17.0)
MCH: 30.9 pg (ref 26.0–34.0)
MCHC: 33.8 g/dL (ref 30.0–36.0)
MCV: 91.5 fL (ref 80.0–100.0)
Platelets: 170 10*3/uL (ref 150–400)
RBC: 4.11 MIL/uL — ABNORMAL LOW (ref 4.22–5.81)
RDW: 17.3 % — ABNORMAL HIGH (ref 11.5–15.5)
WBC: 9.3 10*3/uL (ref 4.0–10.5)
nRBC: 0 % (ref 0.0–0.2)

## 2019-07-13 LAB — BASIC METABOLIC PANEL
Anion gap: 9 (ref 5–15)
BUN: 15 mg/dL (ref 8–23)
CO2: 26 mmol/L (ref 22–32)
Calcium: 8.5 mg/dL — ABNORMAL LOW (ref 8.9–10.3)
Chloride: 99 mmol/L (ref 98–111)
Creatinine, Ser: 0.5 mg/dL — ABNORMAL LOW (ref 0.61–1.24)
GFR calc Af Amer: >60 mL/min (ref >60)
GFR calc non Af Amer: >60 mL/min (ref >60)
Glucose, Bld: 144 mg/dL — ABNORMAL HIGH (ref 70–99)
Potassium: 3.9 mmol/L (ref 3.5–5.1)
Sodium: 134 mmol/L — ABNORMAL LOW (ref 135–145)

## 2019-07-13 LAB — MAGNESIUM: Magnesium: 1.8 mg/dL (ref 1.7–2.4)

## 2019-07-13 LAB — HEPARIN LEVEL (UNFRACTIONATED)
Heparin Unfractionated: 0.68 IU/mL (ref 0.30–0.70)
Heparin Unfractionated: 0.18 IU/mL — ABNORMAL LOW (ref 0.30–0.70)
Heparin Unfractionated: 0.35 IU/mL (ref 0.30–0.70)

## 2019-07-13 LAB — PROTIME-INR
INR: 1.2 (ref 0.8–1.2)
Prothrombin Time: 14.9 seconds (ref 11.4–15.2)

## 2019-07-14 LAB — PROTIME-INR
INR: 1.3 — ABNORMAL HIGH (ref 0.8–1.2)
Prothrombin Time: 16 seconds — ABNORMAL HIGH (ref 11.4–15.2)

## 2019-07-14 LAB — HEPARIN LEVEL (UNFRACTIONATED)
Heparin Unfractionated: 0.4 IU/mL (ref 0.30–0.70)
Heparin Unfractionated: 0.2 IU/mL — ABNORMAL LOW (ref 0.30–0.70)
Heparin Unfractionated: 0.55 IU/mL (ref 0.30–0.70)

## 2019-07-15 ENCOUNTER — Other Ambulatory Visit (HOSPITAL_COMMUNITY): Payer: Medicare Other

## 2019-07-15 LAB — CBC
HCT: 42.3 % (ref 39.0–52.0)
Hemoglobin: 14.3 g/dL (ref 13.0–17.0)
MCH: 31.3 pg (ref 26.0–34.0)
MCHC: 33.8 g/dL (ref 30.0–36.0)
MCV: 92.6 fL (ref 80.0–100.0)
Platelets: 185 10*3/uL (ref 150–400)
RBC: 4.57 MIL/uL (ref 4.22–5.81)
RDW: 17.5 % — ABNORMAL HIGH (ref 11.5–15.5)
WBC: 8.5 10*3/uL (ref 4.0–10.5)
nRBC: 0 % (ref 0.0–0.2)

## 2019-07-15 LAB — BASIC METABOLIC PANEL
Anion gap: 11 (ref 5–15)
BUN: 5 mg/dL — ABNORMAL LOW (ref 8–23)
CO2: 24 mmol/L (ref 22–32)
Calcium: 9.2 mg/dL (ref 8.9–10.3)
Chloride: 101 mmol/L (ref 98–111)
Creatinine, Ser: 0.45 mg/dL — ABNORMAL LOW (ref 0.61–1.24)
GFR calc Af Amer: >60 mL/min (ref >60)
GFR calc non Af Amer: >60 mL/min (ref >60)
Glucose, Bld: 103 mg/dL — ABNORMAL HIGH (ref 70–99)
Potassium: 4.3 mmol/L (ref 3.5–5.1)
Sodium: 136 mmol/L (ref 135–145)

## 2019-07-15 LAB — PROTIME-INR
INR: 1.7 — ABNORMAL HIGH (ref 0.8–1.2)
Prothrombin Time: 19.6 seconds — ABNORMAL HIGH (ref 11.4–15.2)

## 2019-07-15 LAB — HEPARIN LEVEL (UNFRACTIONATED)
Heparin Unfractionated: 0.22 IU/mL — ABNORMAL LOW (ref 0.30–0.70)
Heparin Unfractionated: 0.77 IU/mL — ABNORMAL HIGH (ref 0.30–0.70)
Heparin Unfractionated: <0.1 IU/mL — ABNORMAL LOW (ref 0.30–0.70)

## 2019-07-15 LAB — MAGNESIUM: Magnesium: 1.8 mg/dL (ref 1.7–2.4)

## 2019-07-15 LAB — PHOSPHORUS: Phosphorus: 3.5 mg/dL (ref 2.5–4.6)

## 2019-07-16 LAB — HEPARIN LEVEL (UNFRACTIONATED)
Heparin Unfractionated: 0.17 IU/mL — ABNORMAL LOW (ref 0.30–0.70)
Heparin Unfractionated: 0.54 IU/mL (ref 0.30–0.70)
Heparin Unfractionated: 0.39 IU/mL (ref 0.30–0.70)
Heparin Unfractionated: <0.1 IU/mL — ABNORMAL LOW (ref 0.30–0.70)

## 2019-07-16 LAB — PROTIME-INR
INR: 1.4 — ABNORMAL HIGH (ref 0.8–1.2)
Prothrombin Time: 17.5 seconds — ABNORMAL HIGH (ref 11.4–15.2)

## 2019-07-17 LAB — BASIC METABOLIC PANEL
Anion gap: 9 (ref 5–15)
BUN: 9 mg/dL (ref 8–23)
CO2: 28 mmol/L (ref 22–32)
Calcium: 9.2 mg/dL (ref 8.9–10.3)
Chloride: 98 mmol/L (ref 98–111)
Creatinine, Ser: 0.47 mg/dL — ABNORMAL LOW (ref 0.61–1.24)
GFR calc Af Amer: >60 mL/min (ref >60)
GFR calc non Af Amer: >60 mL/min (ref >60)
Glucose, Bld: 114 mg/dL — ABNORMAL HIGH (ref 70–99)
Potassium: 4.4 mmol/L (ref 3.5–5.1)
Sodium: 135 mmol/L (ref 135–145)

## 2019-07-17 LAB — CBC
HCT: 42.3 % (ref 39.0–52.0)
Hemoglobin: 13.9 g/dL (ref 13.0–17.0)
MCH: 31.1 pg (ref 26.0–34.0)
MCHC: 32.9 g/dL (ref 30.0–36.0)
MCV: 94.6 fL (ref 80.0–100.0)
Platelets: 210 10*3/uL (ref 150–400)
RBC: 4.47 MIL/uL (ref 4.22–5.81)
RDW: 17.4 % — ABNORMAL HIGH (ref 11.5–15.5)
WBC: 8.9 10*3/uL (ref 4.0–10.5)
nRBC: 0 % (ref 0.0–0.2)

## 2019-07-17 LAB — HEPARIN LEVEL (UNFRACTIONATED)
Heparin Unfractionated: 0.5 IU/mL (ref 0.30–0.70)
Heparin Unfractionated: <0.1 IU/mL — ABNORMAL LOW (ref 0.30–0.70)
Heparin Unfractionated: 1.02 IU/mL — ABNORMAL HIGH (ref 0.30–0.70)

## 2019-07-17 LAB — MAGNESIUM: Magnesium: 1.8 mg/dL (ref 1.7–2.4)

## 2019-07-17 LAB — PROTIME-INR
INR: 1.8 — ABNORMAL HIGH (ref 0.8–1.2)
Prothrombin Time: 20.5 seconds — ABNORMAL HIGH (ref 11.4–15.2)

## 2019-07-17 LAB — PHOSPHORUS: Phosphorus: 3.2 mg/dL (ref 2.5–4.6)

## 2019-07-18 LAB — CBC
HCT: 44.5 % (ref 39.0–52.0)
Hemoglobin: 14.7 g/dL (ref 13.0–17.0)
MCH: 31 pg (ref 26.0–34.0)
MCHC: 33 g/dL (ref 30.0–36.0)
MCV: 93.9 fL (ref 80.0–100.0)
Platelets: 209 10*3/uL (ref 150–400)
RBC: 4.74 MIL/uL (ref 4.22–5.81)
RDW: 17.6 % — ABNORMAL HIGH (ref 11.5–15.5)
WBC: 7.2 10*3/uL (ref 4.0–10.5)
nRBC: 0 % (ref 0.0–0.2)

## 2019-07-18 LAB — MAGNESIUM: Magnesium: 1.8 mg/dL (ref 1.7–2.4)

## 2019-07-18 LAB — BASIC METABOLIC PANEL
Anion gap: 10 (ref 5–15)
BUN: 12 mg/dL (ref 8–23)
CO2: 27 mmol/L (ref 22–32)
Calcium: 9.5 mg/dL (ref 8.9–10.3)
Chloride: 99 mmol/L (ref 98–111)
Creatinine, Ser: 0.62 mg/dL (ref 0.61–1.24)
GFR calc Af Amer: >60 mL/min (ref >60)
GFR calc non Af Amer: >60 mL/min (ref >60)
Glucose, Bld: 112 mg/dL — ABNORMAL HIGH (ref 70–99)
Potassium: 4.4 mmol/L (ref 3.5–5.1)
Sodium: 136 mmol/L (ref 135–145)

## 2019-07-18 LAB — PROTIME-INR
INR: 2.6 — ABNORMAL HIGH (ref 0.8–1.2)
Prothrombin Time: 27.4 seconds — ABNORMAL HIGH (ref 11.4–15.2)

## 2019-07-18 LAB — HEPARIN LEVEL (UNFRACTIONATED): Heparin Unfractionated: 0.39 IU/mL (ref 0.30–0.70)

## 2019-07-19 LAB — PROTIME-INR
INR: 2.6 — ABNORMAL HIGH (ref 0.8–1.2)
Prothrombin Time: 27.6 seconds — ABNORMAL HIGH (ref 11.4–15.2)

## 2019-07-20 LAB — PROTIME-INR
INR: 1.8 — ABNORMAL HIGH (ref 0.8–1.2)
Prothrombin Time: 20.6 seconds — ABNORMAL HIGH (ref 11.4–15.2)

## 2019-07-21 LAB — PROTIME-INR
INR: 1.7 — ABNORMAL HIGH (ref 0.8–1.2)
Prothrombin Time: 20.1 seconds — ABNORMAL HIGH (ref 11.4–15.2)

## 2019-07-21 LAB — SARS CORONAVIRUS 2 (TAT 6-24 HRS): SARS Coronavirus 2: NEGATIVE

## 2019-07-22 LAB — BASIC METABOLIC PANEL
Anion gap: 8 (ref 5–15)
BUN: 14 mg/dL (ref 8–23)
CO2: 26 mmol/L (ref 22–32)
Calcium: 9.1 mg/dL (ref 8.9–10.3)
Chloride: 100 mmol/L (ref 98–111)
Creatinine, Ser: 0.47 mg/dL — ABNORMAL LOW (ref 0.61–1.24)
GFR calc Af Amer: >60 mL/min (ref >60)
GFR calc non Af Amer: >60 mL/min (ref >60)
Glucose, Bld: 121 mg/dL — ABNORMAL HIGH (ref 70–99)
Potassium: 4.2 mmol/L (ref 3.5–5.1)
Sodium: 134 mmol/L — ABNORMAL LOW (ref 135–145)

## 2019-07-22 LAB — CBC
HCT: 41.2 % (ref 39.0–52.0)
Hemoglobin: 13.5 g/dL (ref 13.0–17.0)
MCH: 31.1 pg (ref 26.0–34.0)
MCHC: 32.8 g/dL (ref 30.0–36.0)
MCV: 94.9 fL (ref 80.0–100.0)
Platelets: 171 10*3/uL (ref 150–400)
RBC: 4.34 MIL/uL (ref 4.22–5.81)
RDW: 16.8 % — ABNORMAL HIGH (ref 11.5–15.5)
WBC: 7.6 10*3/uL (ref 4.0–10.5)
nRBC: 0 % (ref 0.0–0.2)

## 2019-07-22 LAB — MAGNESIUM
Magnesium: 1.6 mg/dL — ABNORMAL LOW (ref 1.7–2.4)
Magnesium: 2 mg/dL (ref 1.7–2.4)

## 2019-07-22 LAB — PROTIME-INR
INR: 1.4 — ABNORMAL HIGH (ref 0.8–1.2)
Prothrombin Time: 17.3 seconds — ABNORMAL HIGH (ref 11.4–15.2)

## 2019-07-23 LAB — PROTIME-INR
INR: 2.2 — ABNORMAL HIGH (ref 0.8–1.2)
Prothrombin Time: 24.1 seconds — ABNORMAL HIGH (ref 11.4–15.2)

## 2019-07-24 LAB — PROTIME-INR
INR: 2.8 — ABNORMAL HIGH (ref 0.8–1.2)
Prothrombin Time: 29.5 seconds — ABNORMAL HIGH (ref 11.4–15.2)

## 2019-07-25 LAB — BASIC METABOLIC PANEL
Anion gap: 11 (ref 5–15)
BUN: 22 mg/dL (ref 8–23)
CO2: 27 mmol/L (ref 22–32)
Calcium: 9.6 mg/dL (ref 8.9–10.3)
Chloride: 100 mmol/L (ref 98–111)
Creatinine, Ser: 0.63 mg/dL (ref 0.61–1.24)
GFR calc Af Amer: >60 mL/min (ref >60)
GFR calc non Af Amer: >60 mL/min (ref >60)
Glucose, Bld: 108 mg/dL — ABNORMAL HIGH (ref 70–99)
Potassium: 4.8 mmol/L (ref 3.5–5.1)
Sodium: 138 mmol/L (ref 135–145)

## 2019-07-25 LAB — PROTIME-INR
INR: 3.1 — ABNORMAL HIGH (ref 0.8–1.2)
Prothrombin Time: 31.6 seconds — ABNORMAL HIGH (ref 11.4–15.2)

## 2019-09-19 DEATH — deceased

## 2021-10-27 IMAGING — DX DG CHEST 1V PORT
1 series · 1 of 1 positions shown · non-contrast
Comparison: None.

CLINICAL DATA: Aspiration.

EXAM:
PORTABLE CHEST 1 VIEW

[chest ap]
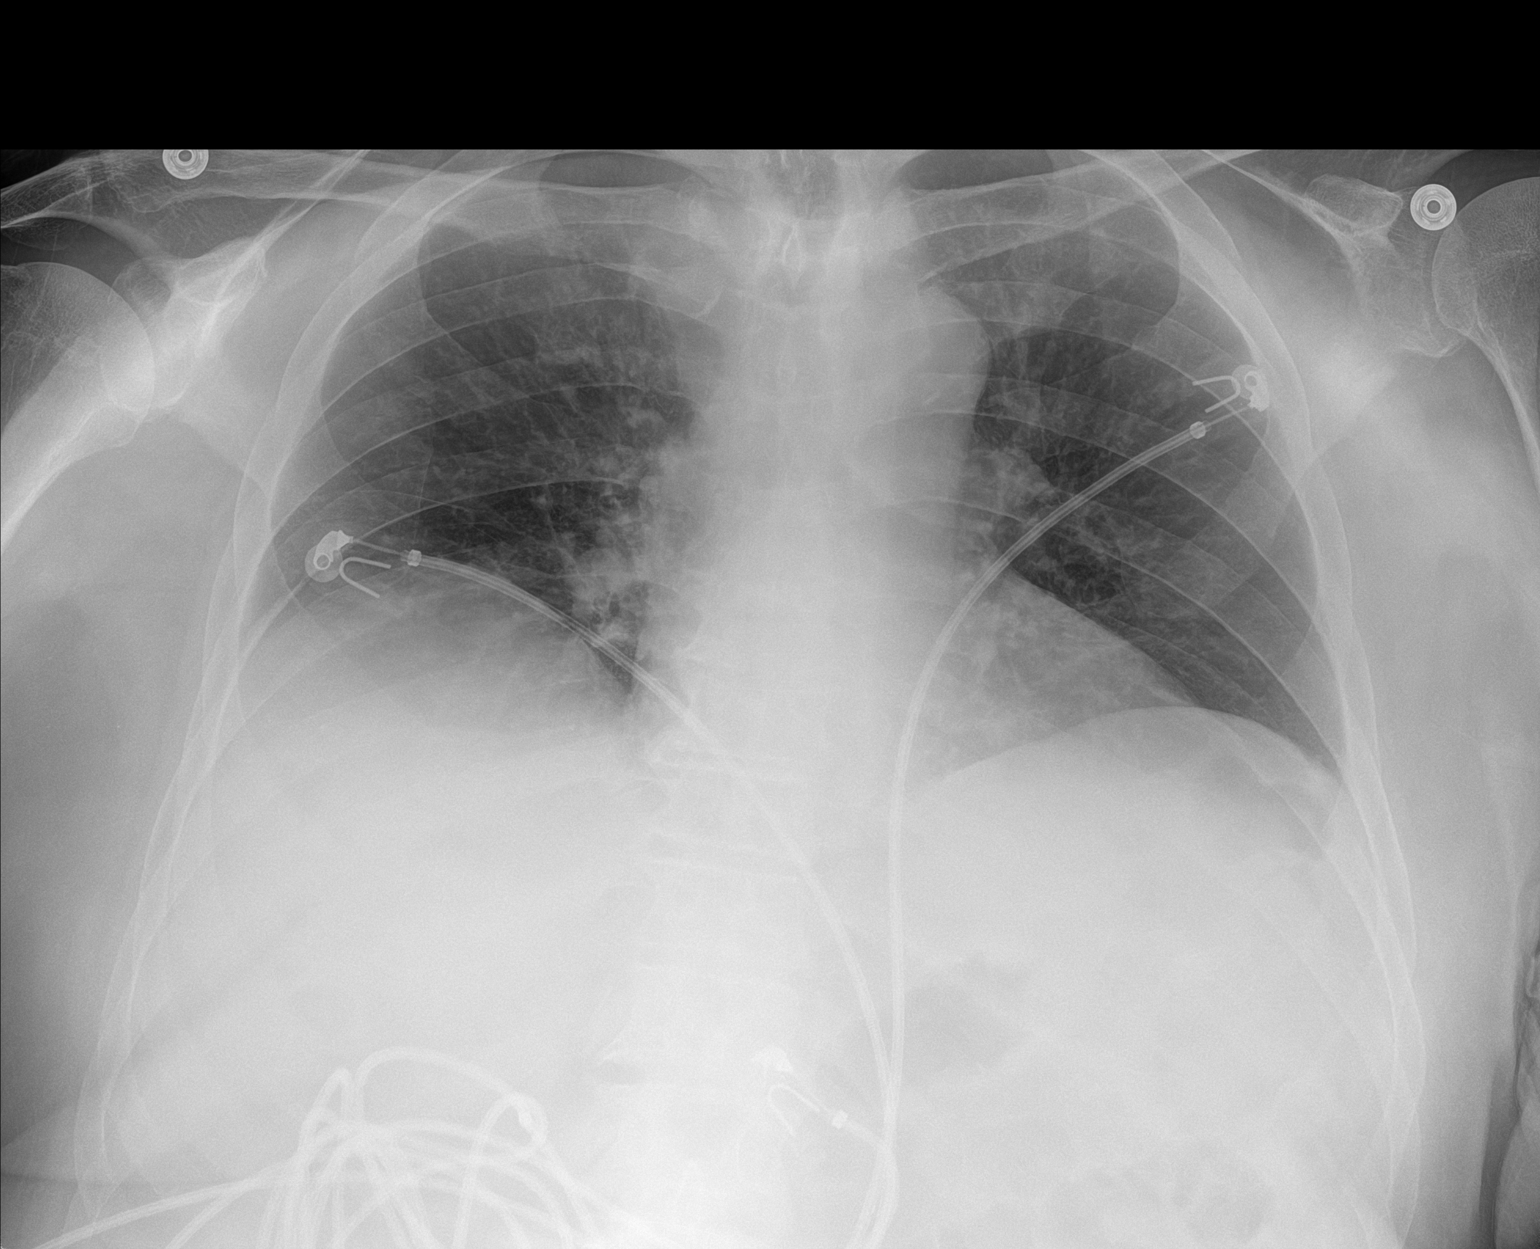

[1 of 1 positions shown; findings below may reference images not displayed]

FINDINGS: Poor inspiration. Normal sized heart. Clear lungs. Thoracic spine
degenerative changes.
IMPRESSION: No acute abnormality.
